# Patient Record
Sex: Male | Born: 1975 | Race: White | Hispanic: No | Marital: Married | Smoking: Former smoker
Health system: Southern US, Community
[De-identification: ages and names within clinical notes are randomized; demographics above are authoritative.]

---

## 1996-08-02 HISTORY — PX: WISDOM TOOTH EXTRACTION: SHX21

## 2004-08-02 HISTORY — PX: CYST REMOVAL TRUNK: SHX6283

## 2004-10-12 ENCOUNTER — Ambulatory Visit: Payer: Self-pay | Admitting: General Surgery

## 2014-11-15 ENCOUNTER — Other Ambulatory Visit: Payer: Self-pay | Admitting: Emergency Medicine

## 2014-11-15 DIAGNOSIS — Z021 Encounter for pre-employment examination: Secondary | ICD-10-CM

## 2014-11-21 ENCOUNTER — Ambulatory Visit
Admission: RE | Admit: 2014-11-21 | Discharge: 2014-11-21 | Disposition: A | Discharge status: Home / Self Care | Payer: BLUE CROSS/BLUE SHIELD | Source: Ambulatory Visit | Attending: Emergency Medicine | Admitting: Emergency Medicine

## 2014-11-21 ENCOUNTER — Other Ambulatory Visit: Payer: Self-pay | Admitting: Emergency Medicine

## 2014-11-21 DIAGNOSIS — Z0000 Encounter for general adult medical examination without abnormal findings: Secondary | ICD-10-CM

## 2014-11-21 DIAGNOSIS — Z021 Encounter for pre-employment examination: Secondary | ICD-10-CM

## 2016-01-12 IMAGING — US US ABDOMEN LIMITED
1 series · 14 of 25 positions shown · non-contrast
Comparison: None.

CLINICAL DATA: Pre-employment evaluation, evaluate gallbladder

EXAM:
US ABDOMEN LIMITED - RIGHT UPPER QUADRANT

[Series 1: us abdomen limited · 0.39mm/px · 14 of 40 slices shown]
[im 1/40]
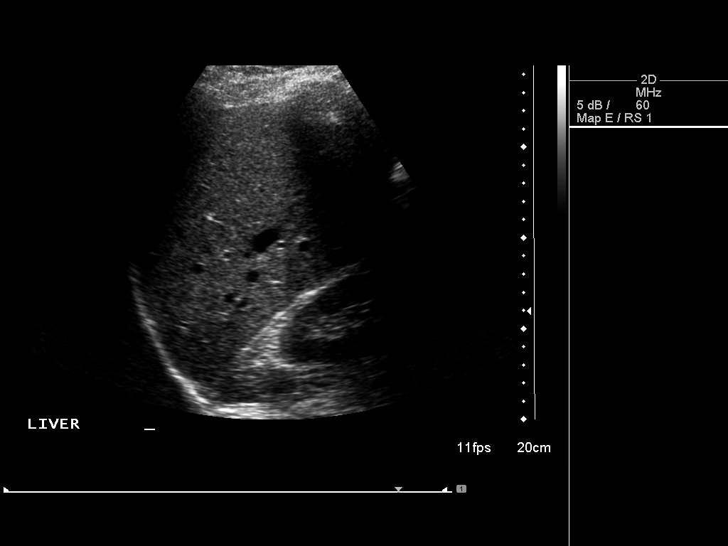
[im 4/40]
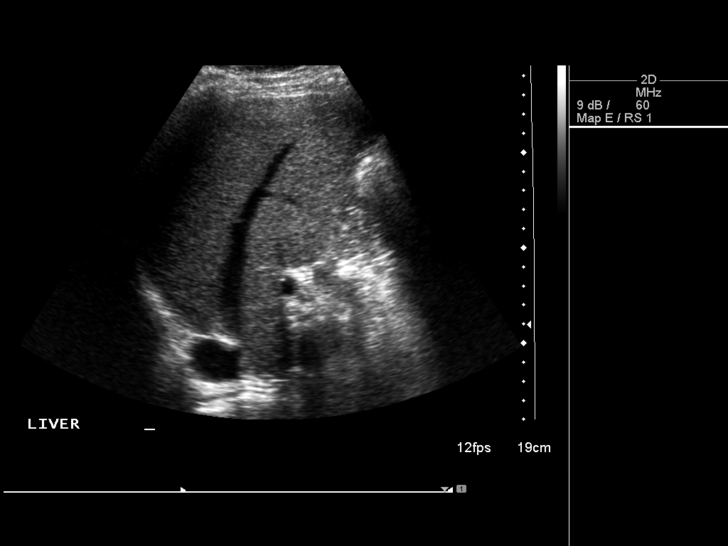
[im 7/40]
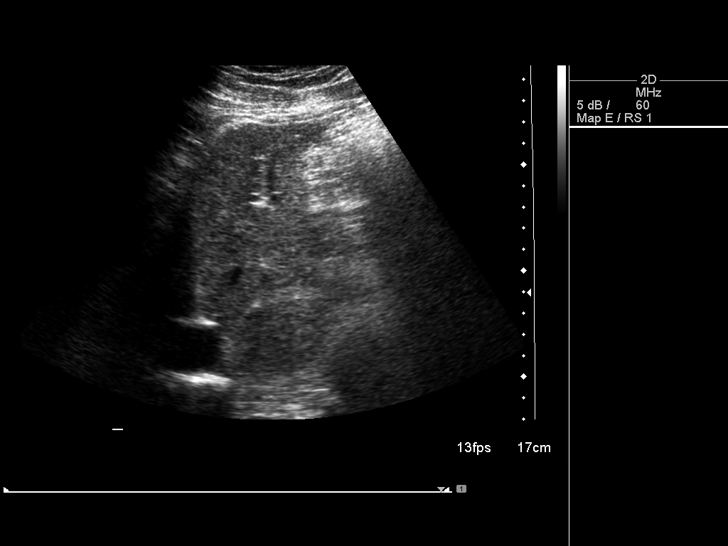
[im 10/40]
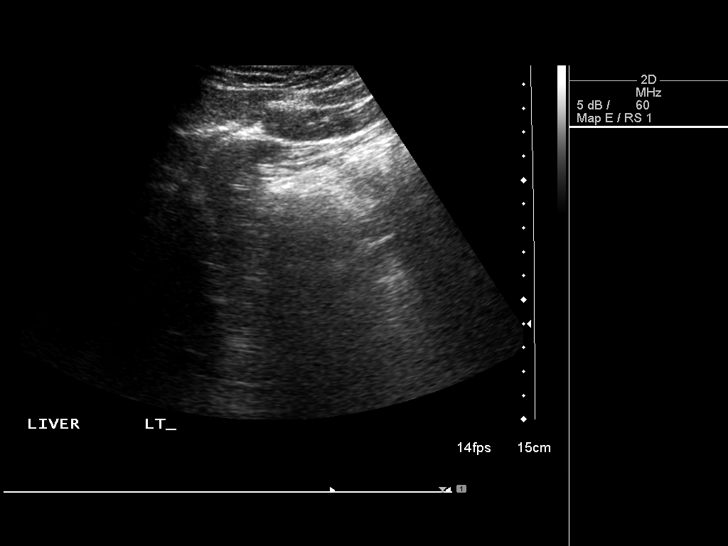
[im 14/40]
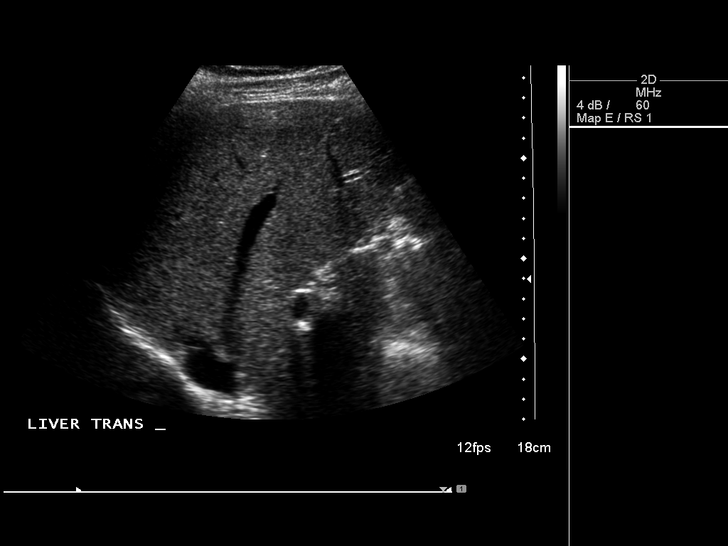
[im 15/40]
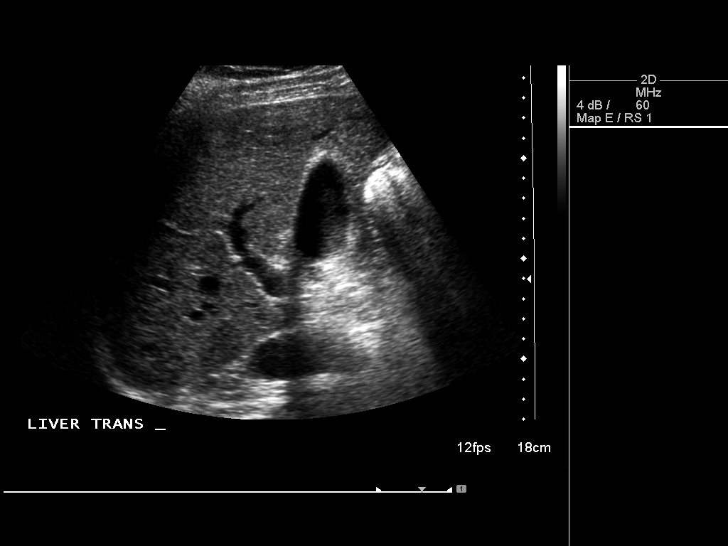
[im 18/40]
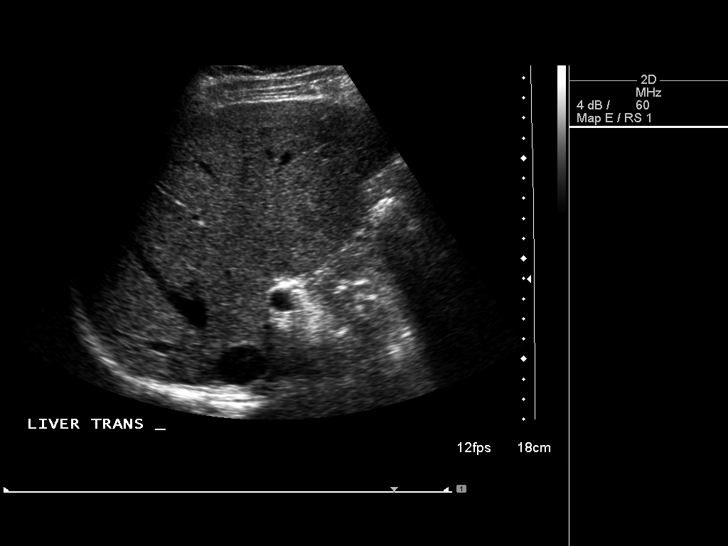
[im 22/40]
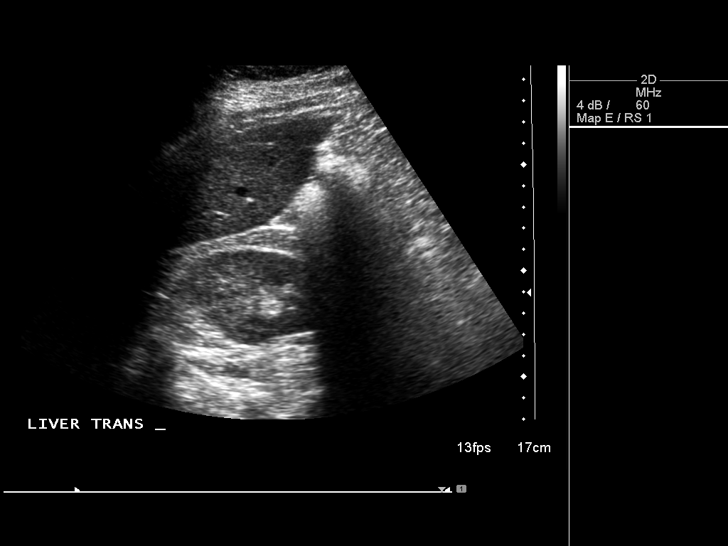
[im 25/40]
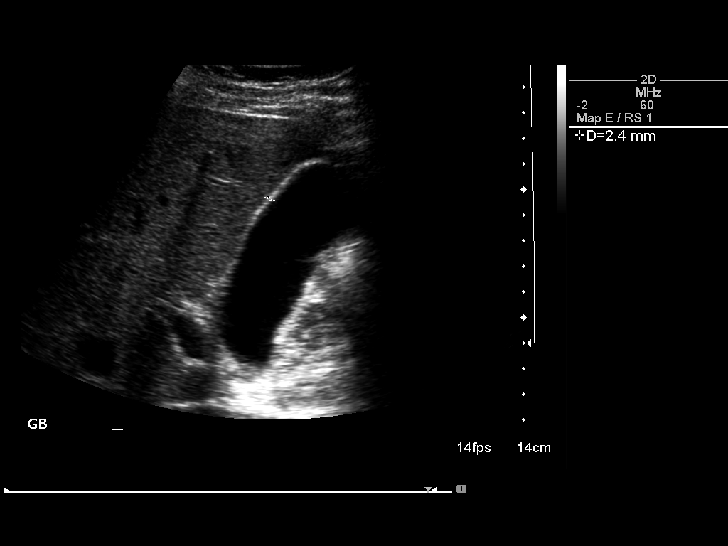
[im 27/40]
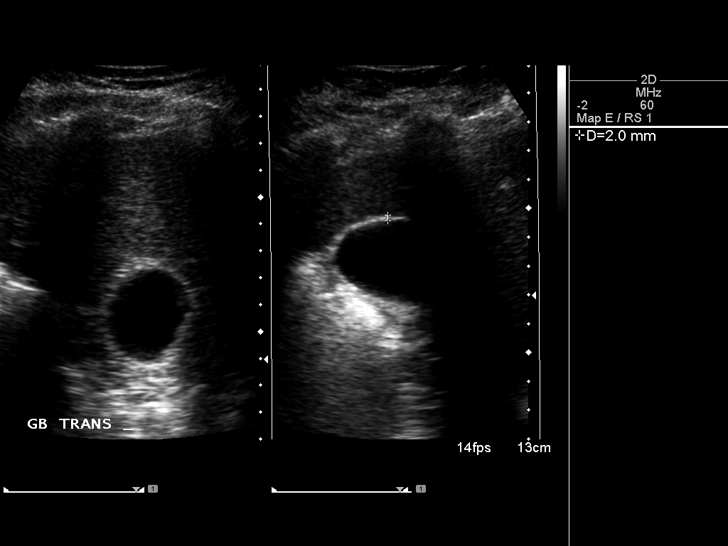
[im 30/40]
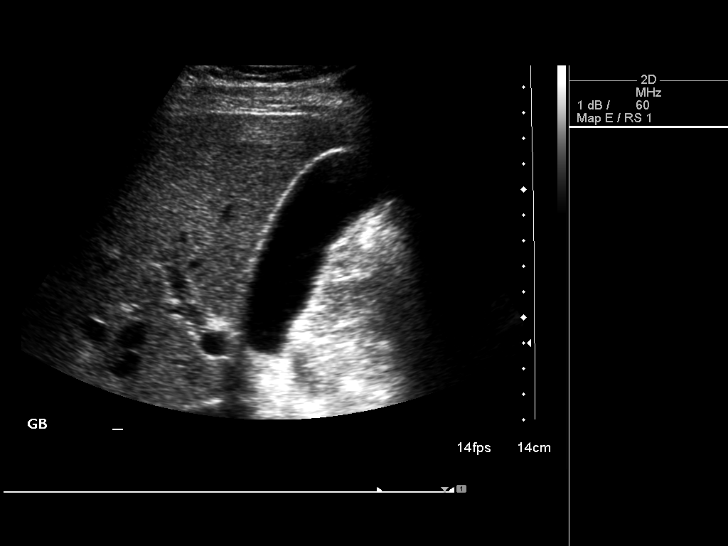
[im 33/40]
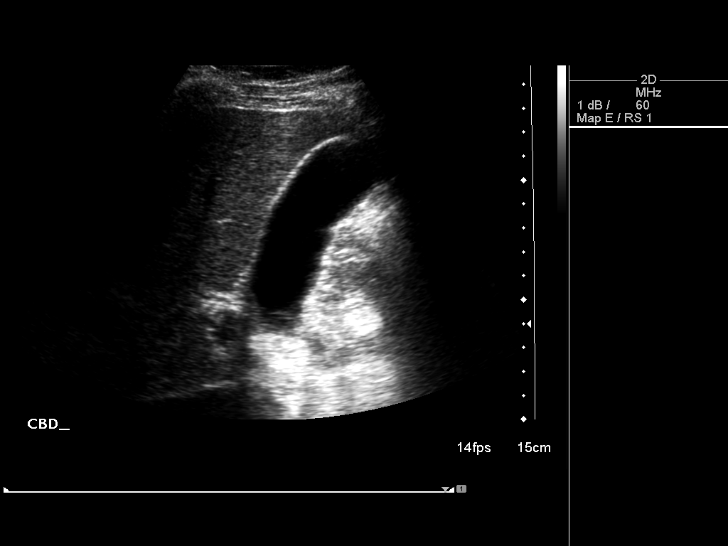
[im 36/40]
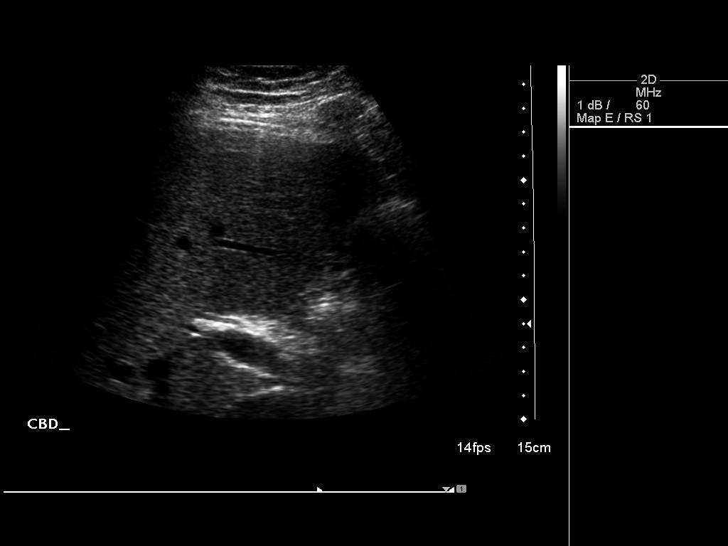
[im 40/40]
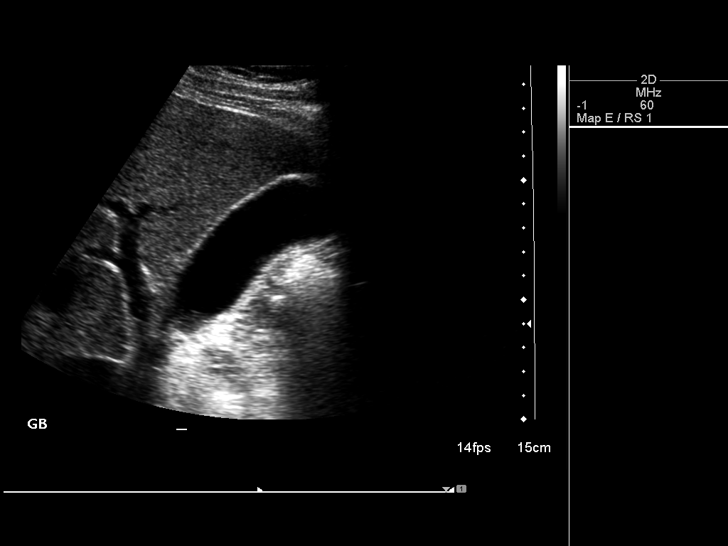

[14 of 25 positions shown; findings below may reference images not displayed]

FINDINGS: Gallbladder:

No gallstones or wall thickening visualized. No sonographic Murphy
sign noted.

Common bile duct:

Diameter: 2.1 mm.

Liver:

No focal lesion identified. Within normal limits in parenchymal
echogenicity.
IMPRESSION: No acute abnormality noted.

## 2016-01-12 IMAGING — CR DG CHEST 2V
2 series · 2 of 2 positions shown · non-contrast
Comparison: None.

CLINICAL DATA: Physical exam

EXAM:
CHEST  2 VIEW

[w chest pa]
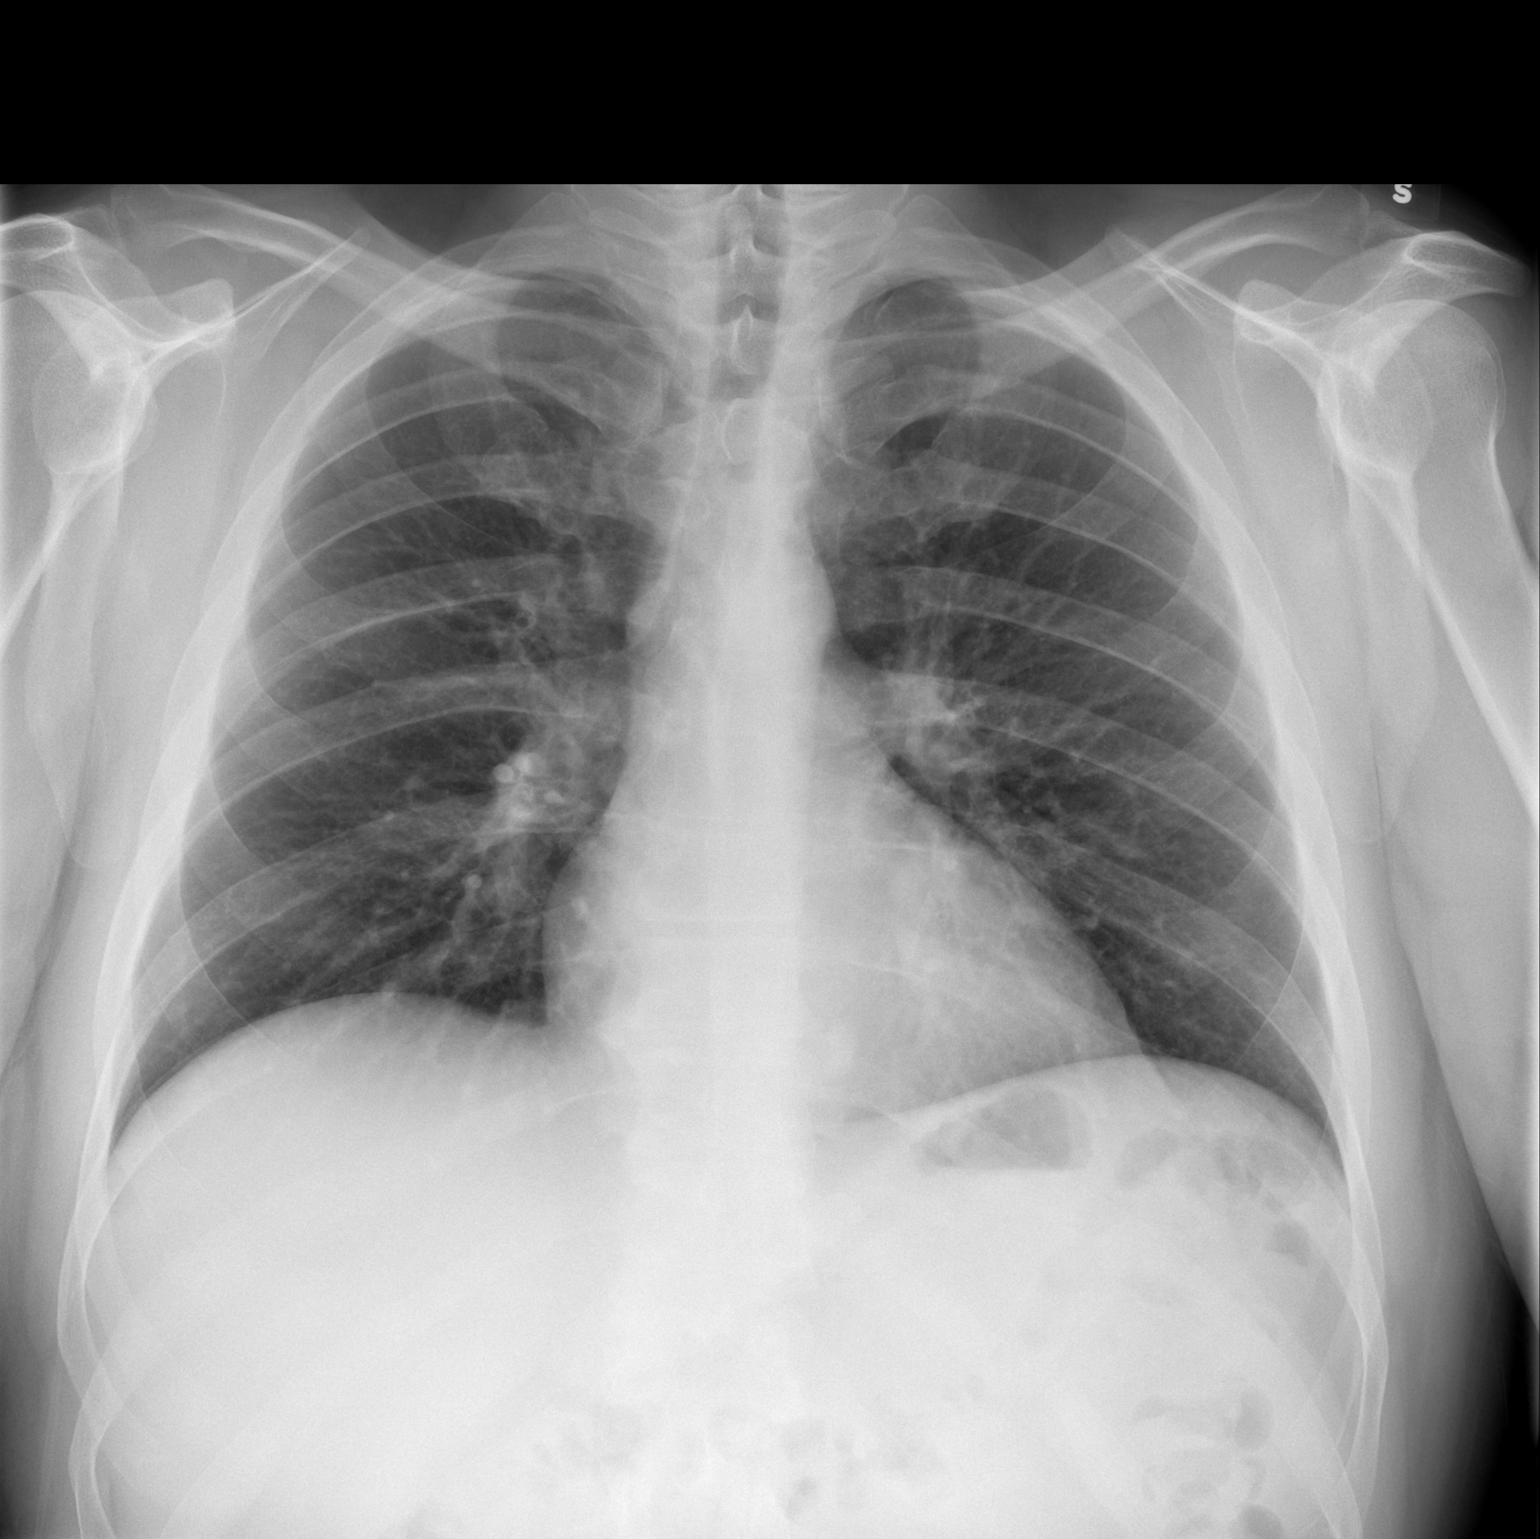

[w chest lat]
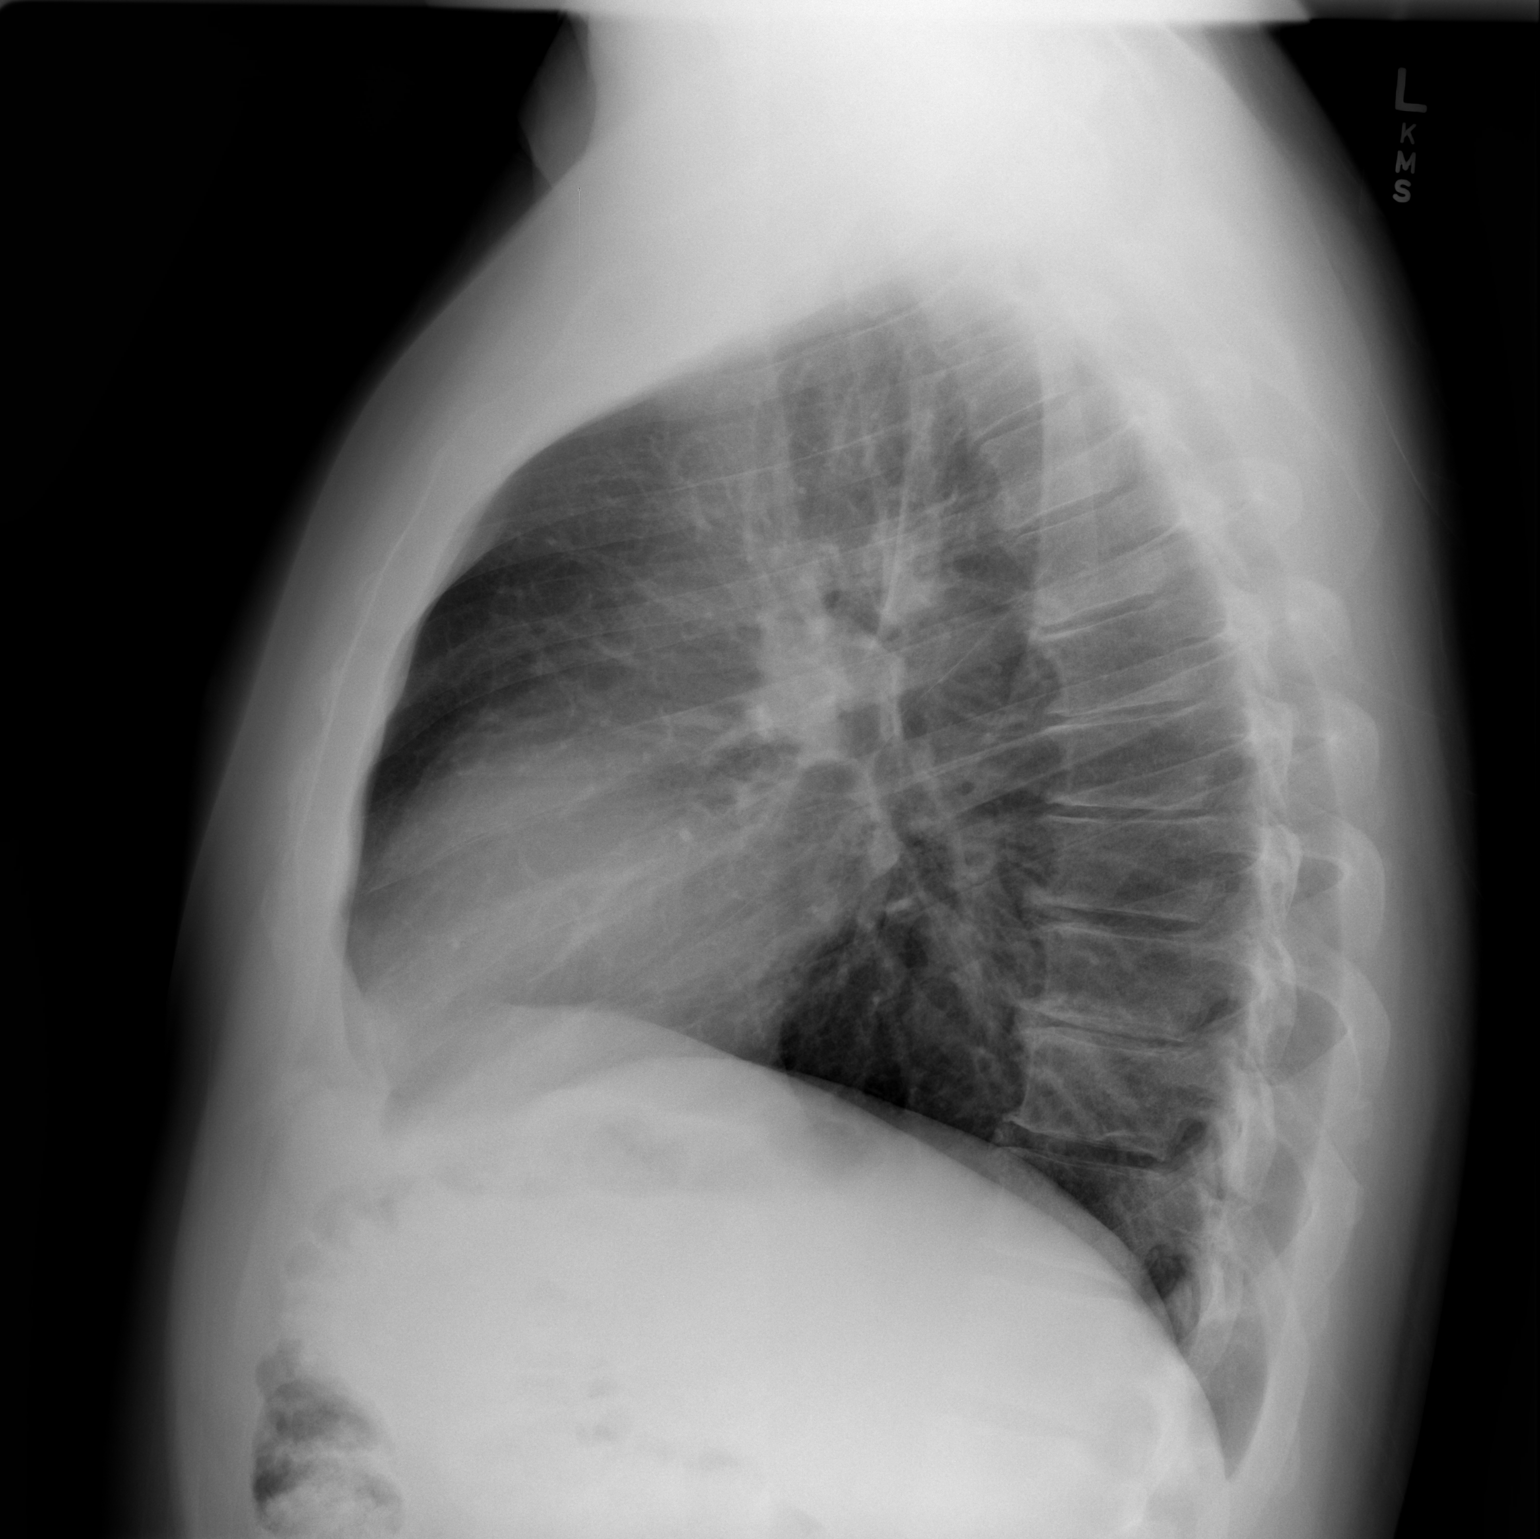

[2 of 2 positions shown; findings below may reference images not displayed]

FINDINGS: No infiltrate or effusion is seen. Mediastinal and hilar contours
are unremarkable. The heart is within normal limits in size. No
acute bony abnormality is seen.
IMPRESSION: No active cardiopulmonary disease.

## 2018-01-09 ENCOUNTER — Telehealth: Payer: Self-pay

## 2018-01-09 NOTE — Telephone Encounter (Signed)
Left message asking pt to call office  °

## 2018-01-09 NOTE — Telephone Encounter (Signed)
PLEASE NOTE: All timestamps contained within this report are represented as [place] Standard Time. CONFIDENTIALTY NOTICE: This fax transmission is intended only for the addressee. It contains information that is legally privileged, confidential or otherwise protected from use or disclosure. If you are not the intended recipient, you are strictly prohibited from reviewing, disclosing, copying using or disseminating any of this information or taking any action in reliance on or regarding this information. If you have received this fax in error, please notify us immediately by telephone so that we can arrange for its return to us. Phone: [phone], Toll-Free: [phone], Fax: [phone] Page: 1 of 1 Call Id: [id number] [facility] Client Nonclinical Telephone Record [facility] Contact Type Call Who Is Calling Patient / Member / Family / Caregiver Caller Name Ashok CroonBryan Siems Caller Phone Number [phone] Patient Name Steven GravelBryan Wenger Patient DOB [birth year] Call Type Message Only Information Provided Reason for Call Request to Reschedule Office Appointment Initial Comment Caller needs to change appt Additional Comment Call Closed By: Lynett FishMarisabel Flores Transaction Date/Time: 01/07/2018 4:18:55 PM (ET)

## 2018-01-11 ENCOUNTER — Ambulatory Visit: Payer: BLUE CROSS/BLUE SHIELD | Admitting: Family Medicine

## 2018-02-01 ENCOUNTER — Encounter (INDEPENDENT_AMBULATORY_CARE_PROVIDER_SITE_OTHER): Payer: Self-pay

## 2018-02-01 ENCOUNTER — Ambulatory Visit: Payer: BLUE CROSS/BLUE SHIELD | Admitting: Family Medicine

## 2018-02-01 ENCOUNTER — Encounter: Payer: Self-pay | Admitting: Family Medicine

## 2018-02-01 VITALS — BP 132/88 | HR 95 | Temp 98.6°F | Ht 72.5 in | Wt 295.5 lb

## 2018-02-01 DIAGNOSIS — Z0000 Encounter for general adult medical examination without abnormal findings: Secondary | ICD-10-CM

## 2018-02-01 DIAGNOSIS — L608 Other nail disorders: Secondary | ICD-10-CM

## 2018-02-01 DIAGNOSIS — W57XXXA Bitten or stung by nonvenomous insect and other nonvenomous arthropods, initial encounter: Secondary | ICD-10-CM | POA: Diagnosis not present

## 2018-02-01 DIAGNOSIS — E669 Obesity, unspecified: Secondary | ICD-10-CM | POA: Diagnosis not present

## 2018-02-01 DIAGNOSIS — Z87898 Personal history of other specified conditions: Secondary | ICD-10-CM

## 2018-02-01 DIAGNOSIS — R5383 Other fatigue: Secondary | ICD-10-CM

## 2018-02-01 NOTE — Patient Instructions (Signed)
It was a pleasure to meet you today! I look forward to partnering with you for your health care needs  Consider screening for obstructive sleep apnea- let me know if you would like a referral  I will notify you of lab results, if normal, please follow up in 1 year for annual physical  Gradual weight loss is best- decrease portions, increase lean proteins and produce.   Keeping you healthy  Get these tests  Blood pressure- Have your blood pressure checked once a year by your healthcare provider.  Normal blood pressure is 120/80.  Weight- Have your body mass index (BMI) calculated to screen for obesity.  BMI is a measure of body fat based on height and weight. You can also calculate your own BMI at [email or web address]-[email or web address]/.  Cholesterol- Have your cholesterol checked regularly starting at age 42, sooner may be necessary if you have diabetes, high blood pressure, if a family member developed heart diseases at an early age or if you smoke.   Chlamydia, HIV, and other sexual transmitted disease- Get screened each year until the age of 42 then within three months of each new sexual partner.  Diabetes- Have your blood sugar checked regularly if you have high blood pressure, high cholesterol, a family history of diabetes or if you are overweight.  Get these vaccines  Flu shot- Every fall.  Tetanus shot- Every 10 years.  Menactra- Single dose; prevents meningitis.  Take these steps  Don't smoke- If you do smoke, ask your healthcare provider about quitting. For tips on how to quit, go to www.smokefree.gov or call 1-800-QUIT-NOW.  Be physically active- Exercise 5 days a week for at least 30 minutes.  If you are not already physically active start slow and gradually work up to 30 minutes of moderate physical activity.  Examples of moderate activity include walking briskly, mowing the yard, dancing, swimming bicycling, etc.  Eat a healthy diet- Eat a variety of healthy foods such as fruits,  vegetables, low fat milk, low fat cheese, yogurt, lean meats, poultry, fish, beans, tofu, etc.  For more information on healthy eating, go to www.thenutritionsource.org  Drink alcohol in moderation- Limit alcohol intake two drinks or less a day.  Never drink and drive.  Dentist- Brush and floss teeth twice daily; visit your dentis twice a year.  Depression-Your emotional health is as important as your physical health.  If you're feeling down, losing interest in things you normally enjoy please talk with your healthcare provider.  Gun Safety- If you keep a gun in your home, keep it unloaded and with the safety lock on.  Bullets should be stored separately.  Helmet use- Always wear a helmet when riding a motorcycle, bicycle, rollerblading or skateboarding.  Safe sex- If you may be exposed to a sexually transmitted infection, use a condom  Seat belts- Seat bels can save your life; always wear one.  Smoke/Carbon Monoxide detectors- These detectors need to be installed on the appropriate level of your home.  Replace batteries at least once a year.  Skin Cancer- When out in the sun, cover up and use sunscreen SPF 15 or higher.  Violence- If anyone is threatening or hurting you, please tell your healthcare provider.

## 2018-02-01 NOTE — Progress Notes (Signed)
Subjective:    Patient ID: Steven AltesBryan G Dillenbeck, male    DOB: 11/09/1975, 42 y.o.   MRN: [id number]  HPI This is a 42 yo male who presents today to establish care and for CPE. 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 Enjoys fishing, bowling, working on cars. [place]     Last CPE- 08/2016 Tdap- 2013 or 2014 Flu- annual Dental- regular Eye- not regular Exercise- not regular  Fatigue- worse with weight gain, has been told he snores and stops breathing during the night.   Diet- does not drink soda, sweet tea or juice, very erratic schedule, drinks 1-2 beers a day, portions too large      No past medical history on file.  Past Surgical History:  Procedure Laterality Date  . CYST REMOVAL TRUNK  2006  . WISDOM TOOTH EXTRACTION  1998   History reviewed. No pertinent family history. Social History   Tobacco Use  . Smoking status: Former Games developermoker  . Smokeless tobacco: Never Used  Substance Use Topics  . Alcohol use: Yes    Comment: 14 DRINKS A WEEK   . Drug use: Never    .   Review of Systems  Constitutional: Positive for fatigue (with weight gain) and unexpected weight change (increased).  HENT: Negative.   Eyes: Negative.   Respiratory: Negative.   Cardiovascular: Negative.   Gastrointestinal: Negative.   Endocrine: Negative.   Genitourinary: Negative.   Musculoskeletal: Negative.   Skin: Negative.        Left great toenail loose at bottom.  Removed tick from left calf yesterday. A little red.   Allergic/Immunologic: Negative.   Neurological: Negative.   Hematological: Negative.   Psychiatric/Behavioral: Negative.        Objective:   Physical Exam Physical Exam  Constitutional: He is oriented to person, place, and time. He appears well-developed and well-nourished.  HENT:  Head: Normocephalic and atraumatic.  Right Ear: External ear normal.  Left Ear: External ear normal.  Nose: Nose normal.  Mouth/Throat: Oropharynx is clear and moist.    Eyes: Conjunctivae are normal. Pupils are equal, round, and reactive to light.  Neck: Normal range of motion. Neck supple.  Cardiovascular: Normal rate, regular rhythm, normal heart sounds and intact distal pulses.   Pulmonary/Chest: Effort normal and breath sounds normal.  Abdominal: Soft. Bowel sounds are normal. Hernia confirmed negative in the right inguinal area and confirmed negative in the left inguinal area.  Genitourinary: Testes normal and penis normal. Circumcised.  Musculoskeletal: Normal range of motion. He exhibits no edema or tenderness.       Cervical back: Normal.       Thoracic back: Normal.       Lumbar back: Normal.  Lymphadenopathy:    He has no cervical adenopathy.       Right: No inguinal adenopathy present.       Left: No inguinal adenopathy present.  Neurological: He is alert and oriented to person, place, and time. He has normal reflexes.  Skin: Skin is warm and dry. Left medical calf with erythematous papule. Right great toenail loose at bottom, new nail growth visible underneath Psychiatric: He has a normal mood and affect. His behavior is normal. Judgment normal.  Vitals reviewed.     BP 132/88 (BP Location: Right Arm, Patient Position: Sitting, Cuff Size: Large)   Pulse 95   Temp 98.6 F (37 C) (Oral)   Ht 6' 0.5" (1.842 m)   Wt 295 lb 8  oz (134 kg)   SpO2 97%   BMI 39.53 kg/m      Depression screen [facility] 2/9 02/01/2018  Decreased Interest 0  PHQ - 2 Score 0    Assessment & Plan:  1. Annual physical exam - Discussed and encouraged healthy lifestyle choices- adequate sleep, regular exercise, stress management and healthy food choices.   2. Obesity (BMI 35.0-39.9 without comorbidity) - discussed weight loss and increasing lean protein/produce, regular mealtimes - CBC with Differential - Comprehensive metabolic panel - TSH - Vitamin D, 25-hydroxy - Lipid Panel  3. Other fatigue - likely related to weight and erratic work schedule - CBC with  Differential - Comprehensive metabolic panel - TSH - Vitamin D, 25-hydroxy  4. Acquired dysmorphic toenail - does not look fungal, not painful, continue to watch  5. Tick bite, initial encounter - RTC/ER precautions reviewed  6. History of snoring - discussed pulmonary referral for sleep study, he is not interested at this time - encouraged weight loss  - follow up in 1 year, sooner if abnormal labs  Olean Ree, FNP-BC  [facility]  02/01/2018 3:05 PM

## 2018-02-02 LAB — CBC WITH DIFFERENTIAL/PLATELET
Basophils Absolute: 8 cells/uL (ref 0–200)
Basophils Relative: 0.1 %
EOS PCT: 0.6 %
Eosinophils Absolute: 49 cells/uL (ref 15–500)
HCT: 44.5 % (ref 38.5–50.0)
Hemoglobin: 15.6 g/dL (ref 13.2–17.1)
Lymphs Abs: 1604 cells/uL (ref 850–3900)
MCH: 31.6 pg (ref 27.0–33.0)
MCHC: 35.1 g/dL (ref 32.0–36.0)
MCV: 90.3 fL (ref 80.0–100.0)
MONOS PCT: 7 %
MPV: 10.1 fL (ref 7.5–12.5)
NEUTROS ABS: 5873 {cells}/uL (ref 1500–7800)
Neutrophils Relative %: 72.5 %
PLATELETS: 275 10*3/uL (ref 140–400)
RBC: 4.93 10*6/uL (ref 4.20–5.80)
RDW: 12.4 % (ref 11.0–15.0)
TOTAL LYMPHOCYTE: 19.8 %
WBC mixed population: 567 cells/uL (ref 200–950)
WBC: 8.1 10*3/uL (ref 3.8–10.8)

## 2018-02-02 LAB — COMPREHENSIVE METABOLIC PANEL
AG Ratio: 1.7 (calc) (ref 1.0–2.5)
ALT: 23 U/L (ref 9–46)
AST: 15 U/L (ref 10–40)
Albumin: 4.4 g/dL (ref 3.6–5.1)
Alkaline phosphatase (APISO): 70 U/L (ref 40–115)
BUN: 19 mg/dL (ref 7–25)
CO2: 26 mmol/L (ref 20–32)
Calcium: 9.3 mg/dL (ref 8.6–10.3)
Chloride: 103 mmol/L (ref 98–110)
Creat: 0.93 mg/dL (ref 0.60–1.35)
Globulin: 2.6 g/dL (calc) (ref 1.9–3.7)
Glucose, Bld: 62 mg/dL — ABNORMAL LOW (ref 65–99)
Potassium: 4.7 mmol/L (ref 3.5–5.3)
Sodium: 141 mmol/L (ref 135–146)
Total Bilirubin: 0.6 mg/dL (ref 0.2–1.2)
Total Protein: 7 g/dL (ref 6.1–8.1)

## 2018-02-02 LAB — LIPID PANEL
CHOL/HDL RATIO: 5.2 (calc) — AB (ref <5.0)
Cholesterol: 188 mg/dL (ref <200)
HDL: 36 mg/dL — ABNORMAL LOW (ref >40)
LDL Cholesterol (Calc): 100 mg/dL (calc) — ABNORMAL HIGH
Non-HDL Cholesterol (Calc): 152 mg/dL (calc) — ABNORMAL HIGH (ref <130)
Triglycerides: 390 mg/dL — ABNORMAL HIGH (ref <150)

## 2018-02-02 LAB — SPECIMEN COMPROMISED

## 2018-02-02 LAB — TSH: TSH: 1.82 mIU/L (ref 0.40–4.50)

## 2018-02-02 LAB — VITAMIN D 25 HYDROXY (VIT D DEFICIENCY, FRACTURES): VIT D 25 HYDROXY: 21 ng/mL — AB (ref 30–100)

## 2018-02-24 DIAGNOSIS — F4322 Adjustment disorder with anxiety: Secondary | ICD-10-CM | POA: Diagnosis not present

## 2018-03-10 DIAGNOSIS — F4322 Adjustment disorder with anxiety: Secondary | ICD-10-CM | POA: Diagnosis not present

## 2018-03-17 DIAGNOSIS — F4322 Adjustment disorder with anxiety: Secondary | ICD-10-CM | POA: Diagnosis not present

## 2018-04-04 DIAGNOSIS — F4322 Adjustment disorder with anxiety: Secondary | ICD-10-CM | POA: Diagnosis not present

## 2018-04-18 DIAGNOSIS — F4322 Adjustment disorder with anxiety: Secondary | ICD-10-CM | POA: Diagnosis not present

## 2018-05-09 DIAGNOSIS — F4322 Adjustment disorder with anxiety: Secondary | ICD-10-CM | POA: Diagnosis not present

## 2018-05-23 DIAGNOSIS — F4322 Adjustment disorder with anxiety: Secondary | ICD-10-CM | POA: Diagnosis not present

## 2018-06-13 DIAGNOSIS — F4322 Adjustment disorder with anxiety: Secondary | ICD-10-CM | POA: Diagnosis not present

## 2018-07-04 DIAGNOSIS — F4322 Adjustment disorder with anxiety: Secondary | ICD-10-CM | POA: Diagnosis not present

## 2018-07-18 DIAGNOSIS — F4322 Adjustment disorder with anxiety: Secondary | ICD-10-CM | POA: Diagnosis not present

## 2018-08-30 DIAGNOSIS — F4322 Adjustment disorder with anxiety: Secondary | ICD-10-CM | POA: Diagnosis not present

## 2018-09-21 DIAGNOSIS — F4322 Adjustment disorder with anxiety: Secondary | ICD-10-CM | POA: Diagnosis not present

## 2018-10-11 DIAGNOSIS — F4322 Adjustment disorder with anxiety: Secondary | ICD-10-CM | POA: Diagnosis not present
